# Patient Record
Sex: Female | Born: 1940 | Race: White | Hispanic: No | Marital: Married | Smoking: Never smoker
Health system: Southern US, Community
[De-identification: ages and names within clinical notes are randomized; demographics above are authoritative.]

## PROBLEM LIST (undated history)

## (undated) DIAGNOSIS — F0280 Dementia in other diseases classified elsewhere without behavioral disturbance: Secondary | ICD-10-CM

## (undated) DIAGNOSIS — I10 Essential (primary) hypertension: Secondary | ICD-10-CM

## (undated) DIAGNOSIS — N814 Uterovaginal prolapse, unspecified: Secondary | ICD-10-CM

## (undated) DIAGNOSIS — G309 Alzheimer's disease, unspecified: Secondary | ICD-10-CM

## (undated) HISTORY — DX: Alzheimer's disease, unspecified: G30.9

## (undated) HISTORY — PX: HEMORROIDECTOMY: SUR656

## (undated) HISTORY — PX: DIAPHRAGM SURGERY: SHX612

## (undated) HISTORY — DX: Essential (primary) hypertension: I10

## (undated) HISTORY — PX: BUNIONECTOMY: SHX129

## (undated) HISTORY — DX: Uterovaginal prolapse, unspecified: N81.4

## (undated) HISTORY — DX: Dementia in other diseases classified elsewhere, unspecified severity, without behavioral disturbance, psychotic disturbance, mood disturbance, and anxiety: F02.80

---

## 1974-09-05 HISTORY — PX: VAGINAL HYSTERECTOMY: SUR661

## 1998-09-02 ENCOUNTER — Other Ambulatory Visit: Admission: RE | Admit: 1998-09-02 | Discharge: 1998-09-02 | Payer: Self-pay | Admitting: Obstetrics and Gynecology

## 1999-09-13 ENCOUNTER — Other Ambulatory Visit: Admission: RE | Admit: 1999-09-13 | Discharge: 1999-09-13 | Payer: Self-pay | Admitting: Obstetrics and Gynecology

## 2000-02-17 ENCOUNTER — Encounter: Payer: Self-pay | Admitting: Gastroenterology

## 2000-02-17 ENCOUNTER — Encounter: Admission: RE | Admit: 2000-02-17 | Discharge: 2000-02-17 | Payer: Self-pay | Admitting: Gastroenterology

## 2000-03-23 ENCOUNTER — Encounter (INDEPENDENT_AMBULATORY_CARE_PROVIDER_SITE_OTHER): Payer: Self-pay | Admitting: Specialist

## 2000-03-23 ENCOUNTER — Ambulatory Visit (HOSPITAL_COMMUNITY): Admission: RE | Admit: 2000-03-23 | Discharge: 2000-03-23 | Payer: Self-pay | Admitting: Gastroenterology

## 2000-08-17 ENCOUNTER — Encounter: Payer: Self-pay | Admitting: Thoracic Surgery

## 2000-08-17 ENCOUNTER — Encounter: Admission: RE | Admit: 2000-08-17 | Discharge: 2000-08-17 | Payer: Self-pay | Admitting: Thoracic Surgery

## 2000-09-11 ENCOUNTER — Encounter: Payer: Self-pay | Admitting: Thoracic Surgery

## 2000-09-11 ENCOUNTER — Inpatient Hospital Stay (HOSPITAL_COMMUNITY): Admission: RE | Admit: 2000-09-11 | Discharge: 2000-09-17 | Payer: Self-pay | Admitting: Thoracic Surgery

## 2000-09-12 ENCOUNTER — Encounter: Payer: Self-pay | Admitting: Thoracic Surgery

## 2000-09-13 ENCOUNTER — Encounter: Payer: Self-pay | Admitting: Thoracic Surgery

## 2000-09-14 ENCOUNTER — Encounter: Payer: Self-pay | Admitting: Thoracic Surgery

## 2000-09-15 ENCOUNTER — Encounter: Payer: Self-pay | Admitting: Thoracic Surgery

## 2000-09-16 ENCOUNTER — Encounter: Payer: Self-pay | Admitting: Thoracic Surgery

## 2000-09-17 ENCOUNTER — Encounter: Payer: Self-pay | Admitting: Thoracic Surgery

## 2000-09-20 ENCOUNTER — Encounter: Payer: Self-pay | Admitting: Thoracic Surgery

## 2000-09-20 ENCOUNTER — Encounter: Admission: RE | Admit: 2000-09-20 | Discharge: 2000-09-20 | Payer: Self-pay | Admitting: Thoracic Surgery

## 2000-10-03 ENCOUNTER — Encounter: Payer: Self-pay | Admitting: Thoracic Surgery

## 2000-10-03 ENCOUNTER — Encounter: Admission: RE | Admit: 2000-10-03 | Discharge: 2000-10-03 | Payer: Self-pay | Admitting: Thoracic Surgery

## 2000-10-25 ENCOUNTER — Encounter: Payer: Self-pay | Admitting: Thoracic Surgery

## 2000-10-25 ENCOUNTER — Encounter: Admission: RE | Admit: 2000-10-25 | Discharge: 2000-10-25 | Payer: Self-pay | Admitting: Thoracic Surgery

## 2000-11-28 ENCOUNTER — Other Ambulatory Visit: Admission: RE | Admit: 2000-11-28 | Discharge: 2000-11-28 | Payer: Self-pay | Admitting: Obstetrics and Gynecology

## 2000-12-12 ENCOUNTER — Encounter: Admission: RE | Admit: 2000-12-12 | Discharge: 2000-12-12 | Payer: Self-pay | Admitting: Thoracic Surgery

## 2000-12-12 ENCOUNTER — Encounter: Payer: Self-pay | Admitting: Thoracic Surgery

## 2001-01-23 ENCOUNTER — Encounter: Payer: Self-pay | Admitting: Thoracic Surgery

## 2001-01-23 ENCOUNTER — Encounter: Admission: RE | Admit: 2001-01-23 | Discharge: 2001-01-23 | Payer: Self-pay | Admitting: Thoracic Surgery

## 2001-02-19 ENCOUNTER — Encounter: Admission: RE | Admit: 2001-02-19 | Discharge: 2001-02-19 | Payer: Self-pay | Admitting: Gastroenterology

## 2001-02-19 ENCOUNTER — Encounter: Payer: Self-pay | Admitting: Gastroenterology

## 2001-03-27 ENCOUNTER — Encounter: Payer: Self-pay | Admitting: Thoracic Surgery

## 2001-03-27 ENCOUNTER — Encounter: Admission: RE | Admit: 2001-03-27 | Discharge: 2001-03-27 | Payer: Self-pay | Admitting: Thoracic Surgery

## 2001-12-03 ENCOUNTER — Other Ambulatory Visit: Admission: RE | Admit: 2001-12-03 | Discharge: 2001-12-03 | Payer: Self-pay | Admitting: Obstetrics and Gynecology

## 2002-02-26 ENCOUNTER — Encounter: Payer: Self-pay | Admitting: Obstetrics and Gynecology

## 2002-02-26 ENCOUNTER — Encounter: Admission: RE | Admit: 2002-02-26 | Discharge: 2002-02-26 | Payer: Self-pay | Admitting: Obstetrics and Gynecology

## 2002-12-27 ENCOUNTER — Other Ambulatory Visit: Admission: RE | Admit: 2002-12-27 | Discharge: 2002-12-27 | Payer: Self-pay | Admitting: Obstetrics and Gynecology

## 2003-03-03 ENCOUNTER — Encounter: Payer: Self-pay | Admitting: Obstetrics and Gynecology

## 2003-03-03 ENCOUNTER — Ambulatory Visit (HOSPITAL_COMMUNITY): Admission: RE | Admit: 2003-03-03 | Discharge: 2003-03-03 | Payer: Self-pay | Admitting: Obstetrics and Gynecology

## 2003-03-24 ENCOUNTER — Encounter: Admission: RE | Admit: 2003-03-24 | Discharge: 2003-03-24 | Payer: Self-pay | Admitting: Obstetrics and Gynecology

## 2003-03-24 ENCOUNTER — Encounter: Payer: Self-pay | Admitting: Obstetrics and Gynecology

## 2003-12-29 ENCOUNTER — Other Ambulatory Visit: Admission: RE | Admit: 2003-12-29 | Discharge: 2003-12-29 | Payer: Self-pay | Admitting: Obstetrics and Gynecology

## 2004-03-03 ENCOUNTER — Ambulatory Visit (HOSPITAL_COMMUNITY): Admission: RE | Admit: 2004-03-03 | Discharge: 2004-03-03 | Payer: Self-pay | Admitting: Obstetrics and Gynecology

## 2005-05-30 ENCOUNTER — Other Ambulatory Visit: Admission: RE | Admit: 2005-05-30 | Discharge: 2005-05-30 | Payer: Self-pay | Admitting: Obstetrics and Gynecology

## 2005-05-30 ENCOUNTER — Ambulatory Visit (HOSPITAL_COMMUNITY): Admission: RE | Admit: 2005-05-30 | Discharge: 2005-05-30 | Payer: Self-pay | Admitting: Obstetrics and Gynecology

## 2006-06-14 ENCOUNTER — Ambulatory Visit (HOSPITAL_COMMUNITY): Admission: RE | Admit: 2006-06-14 | Discharge: 2006-06-14 | Payer: Self-pay | Admitting: Internal Medicine

## 2007-06-13 ENCOUNTER — Other Ambulatory Visit: Admission: RE | Admit: 2007-06-13 | Discharge: 2007-06-13 | Payer: Self-pay | Admitting: Obstetrics and Gynecology

## 2007-06-20 ENCOUNTER — Ambulatory Visit (HOSPITAL_COMMUNITY): Admission: RE | Admit: 2007-06-20 | Discharge: 2007-06-20 | Payer: Self-pay | Admitting: Internal Medicine

## 2007-06-21 ENCOUNTER — Ambulatory Visit (HOSPITAL_COMMUNITY): Admission: RE | Admit: 2007-06-21 | Discharge: 2007-06-21 | Payer: Self-pay | Admitting: Obstetrics and Gynecology

## 2008-06-18 ENCOUNTER — Ambulatory Visit: Payer: Self-pay | Admitting: Obstetrics and Gynecology

## 2008-06-25 ENCOUNTER — Ambulatory Visit (HOSPITAL_COMMUNITY): Admission: RE | Admit: 2008-06-25 | Discharge: 2008-06-25 | Payer: Self-pay | Admitting: Obstetrics and Gynecology

## 2009-06-24 ENCOUNTER — Ambulatory Visit: Payer: Self-pay | Admitting: Obstetrics and Gynecology

## 2009-06-24 ENCOUNTER — Other Ambulatory Visit: Admission: RE | Admit: 2009-06-24 | Discharge: 2009-06-24 | Payer: Self-pay | Admitting: Obstetrics and Gynecology

## 2009-06-24 ENCOUNTER — Encounter: Payer: Self-pay | Admitting: Obstetrics and Gynecology

## 2009-07-15 ENCOUNTER — Ambulatory Visit (HOSPITAL_COMMUNITY): Admission: RE | Admit: 2009-07-15 | Discharge: 2009-07-15 | Payer: Self-pay | Admitting: Obstetrics and Gynecology

## 2010-06-30 ENCOUNTER — Ambulatory Visit: Payer: Self-pay | Admitting: Obstetrics and Gynecology

## 2010-07-20 ENCOUNTER — Ambulatory Visit: Payer: Self-pay | Admitting: Obstetrics and Gynecology

## 2010-07-21 ENCOUNTER — Ambulatory Visit (HOSPITAL_COMMUNITY): Admission: RE | Admit: 2010-07-21 | Discharge: 2010-07-21 | Payer: Self-pay | Admitting: Obstetrics and Gynecology

## 2010-09-26 ENCOUNTER — Encounter: Payer: Self-pay | Admitting: Internal Medicine
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
## 2011-01-21 NOTE — Discharge Summary (Signed)
[place]. [facility]  Patient:    Jocelyn Wolfe, Jocelyn Wolfe                    MRN: [id number] Adm. Date:  [id number] Disc. Date: 09/16/00 Attending:  Cameron Proud Dictator:   Adair Patter, P.A. CC:         Durwin Reges., M.D.  Genene Churn. Sherin Quarry, M.D.   Discharge Summary  DATE OF BIRTH:  [birth year]  ADMITTING DIAGNOSIS:  Left diaphragmatic eventration.  SECONDARY DIAGNOSES: 1. Hypertension. 2. Hypercholesterolemia. 3. Gastroesophageal reflux disease. 4. Asthma. 5. Depression. 5. Ulcerative colitis.  DISCHARGE DIAGNOSES: Status post plication of left hemidiaphragm.  HOSPITAL PROCEDURES: 1. Left video-assisted thoracoscopy. 2. Left thoracotomy. 3. Plication of left hemidiaphragm.  HOSPITAL COURSE:  Jocelyn Wolfe was admitted to [facility] on September 11, 2000, secondary to left diaphragmatic eventration.  She underwent left video-assisted thoracoscopy, left thoracotomy and plication of left hemidiaphragm.  This procedure was performed by Dr. Edwyna Shell under general endotracheal anesthesia.  No complications were noted during the procedure. The patient had an uneventful hospital course.  She was discharged home in stable and satisfactory condition on September 16, 2000.  DISCHARGE MEDICATIONS:  1. Tylox one to two tablets every four to six hours as needed for pain.  2. Lipitor 10 mg 1/2 tablet daily.  3. Norvasc 10 mg one tablet daily.  4. Celexa 20 mg 1/2 tablet daily.  5. Glucosamine 1000 mg one tablet daily.  6. Aspirin 81 mg one tablet daily.  7. Azulfidine 500 mg one tablet q.d.  8. Guaifenesin 600 mg two tablets twice daily.  9. Tylenol as needed. 10. Enalapril 10 mg 1-1/2 tablets twice daily. 11. Folic acid 1 mg one tablet daily. 12. Vaseretic 10/25 mg 1/2 tablet daily. 13. Premarin 0.625 mg one tablet daily. 14. Albuterol inhaler two puffs every four hours as needed. 15. Advair 250/50 one tablet twice  daily.  DISCHARGE ACTIVITY:  The patient is told to avoid strenuous activity.  DISCHARGE DIET:  The patient is told to consume low fat, low salt, well balanced diet.  WOUND CARE:  The patient was told she could shower and clean wounds with mild soap and water.  DISPOSITION:  Home.  FOLLOW-UP:  The patient is to follow up with Dr. Edwyna Shell at his office on September 22, 2000, at 2:30 p.m.  She is also told to go to [facility] one hour before her appointment with Dr. Edwyna Shell to have a chest x-ray taken. DD:  09/15/00 TD:  09/16/00 Job: [id number]

## 2011-01-21 NOTE — Procedures (Signed)
[place]. [facility]  Patient:    Jocelyn Wolfe, Jocelyn Wolfe                    MRN: [id number] Proc. Date: 09/11/00 Adm. Date:  [id number] Attending:  Cameron Proud CC:         Genene Churn. Sherin Quarry, M.D.  Durwin Reges., M.D.   Procedure Report  PREOPERATIVE DIAGNOSIS:  Paralyzed left diaphragm or ____________ left diaphragm with marked dyspnea.  POSTOPERATIVE DIAGNOSIS:  Paralyzed left diaphragm or ____________ left diaphragm with marked dyspnea.  OPERATION PERFORMED:  Plication of left diaphragm, ____________  SURGEON:  D. Karle Plumber, M.D.  ASSISTANT:  Mr. Rolan Bucco  ANESTHESIA:  General.  DESCRIPTION OF PROCEDURE:  After percutaneous insertion of all monitoring lines, the patient underwent general anesthesia.  He was prepped and draped in the usual sterile manner.  ____________ general left lateral thoracotomy position this patient had a progressive left diaphragm elevation that was causing marked dyspnea ____________ start about 18 months.  It looked like it was probably paralyzed although eventration could not be ruled out.  CT scan confirmed and it was decided to do a plication in order to improve the left lower lobe atelectasis and ____________.  To trocar sites were made in the anterior and posterior axillary lines at the fifth intercostal space and the 0 degree scope was inserted. The diaphragm could be seen markedly elevated.  It looked like the muscle was intact peripherally with stretching out of the central tendon.  It looked more like eventration than a paralyzed diaphragm. A 7 cm incision incision was made over the 9th intercostal space at the midaxillary line and a small Tuffier was inserted and then starting laterally, an interrupted suture was placed in the diaphragm.  The midportion of the diaphragm was pushed down with a Kaiser ring forceps and the diaphragm was plicated with an over and over stitch going from  lateral to medial and then from medial back to lateral with #1 Prolene.  After this had been done, another suture was run from laterally to medially and back over this further imbricating the diaphragm to bring the diaphragm back to its normal position. Several interrupted #1 Prolenes were placed to further reinforce the suture line.  After this had been accomplished, the chest tube was placed at the anterior trocar site and then through the posterior trocar site.  The posterior trocar site was closed with 3-0 Vicryl.  Another chest tube was placed posteriorly.  The lung was re-expanded.  The chest was closed with #2 chromic and #1 Vicryl in the muscle layer and 3-0 Vicryl as a subcuticular stitch.  The patient returned to the recovery room in stable condition. DD:  09/11/00 TD:  09/11/00 Job: [id number]

## 2011-01-21 NOTE — Discharge Summary (Signed)
[place]. [facility]  Patient:    Jocelyn Wolfe, Jocelyn Wolfe                    MRN: [id number] Adm. Date:  [id number] Disc. Date: [id number] Attending:  Cameron Proud Dictator:   Lissa Merlin, P.A.                           Discharge Summary  ADDENDUM:  Jocelyn Wolfe was originally anticipated to be discharged on September 16, 2000.  However, she complained of nausea and constipation.  Her discharge was held.  Abdominal x-ray was taken which showed no bowel obstruction wtih normal stool pattern.  She was given Phenergan for nausea and Dulcolax, and mag citrate and soapsuds enema.  This produced bowel movement.  By the next day, Jocelyn Wolfe felt better with no more nausea.  She was deemed stable and suitable for discharge home and was subsequently discharged.  There were no changes with her previous discharge instructions or medications. DD:  09/25/00 TD:  09/26/00 Job: [id number]

## 2011-06-13 ENCOUNTER — Other Ambulatory Visit: Payer: Self-pay | Admitting: Obstetrics and Gynecology

## 2011-06-13 DIAGNOSIS — Z1231 Encounter for screening mammogram for malignant neoplasm of breast: Secondary | ICD-10-CM

## 2011-06-28 DIAGNOSIS — G309 Alzheimer's disease, unspecified: Secondary | ICD-10-CM | POA: Insufficient documentation

## 2011-06-28 DIAGNOSIS — N814 Uterovaginal prolapse, unspecified: Secondary | ICD-10-CM | POA: Insufficient documentation

## 2011-07-06 ENCOUNTER — Ambulatory Visit (INDEPENDENT_AMBULATORY_CARE_PROVIDER_SITE_OTHER): Payer: Medicare Other | Admitting: Obstetrics and Gynecology

## 2011-07-06 ENCOUNTER — Encounter: Payer: Self-pay | Admitting: Obstetrics and Gynecology

## 2011-07-06 VITALS — BP 124/80 | Ht 61.0 in | Wt 123.0 lb

## 2011-07-06 DIAGNOSIS — R351 Nocturia: Secondary | ICD-10-CM

## 2011-07-06 DIAGNOSIS — I10 Essential (primary) hypertension: Secondary | ICD-10-CM | POA: Insufficient documentation

## 2011-07-06 DIAGNOSIS — N816 Rectocele: Secondary | ICD-10-CM

## 2011-07-06 DIAGNOSIS — N952 Postmenopausal atrophic vaginitis: Secondary | ICD-10-CM

## 2011-07-06 DIAGNOSIS — R634 Abnormal weight loss: Secondary | ICD-10-CM

## 2011-07-06 NOTE — Progress Notes (Signed)
Subjective:     Patient ID: Jocelyn Wolfe, female   DOB: June 16, 1941, 70 y.o.   MRN: [id number]  HPIpatient came back to see me today for further followup. She has severe Alzheimer's and cannot give [place] a history. She came with her husband who is a very good historian. He thinks her biggest issue is weight loss. In reviewing her records she has lost 14 pounds from her last visit. The husband thing she's really eating a she used to. They have discussed this with her PCP who is just instructed to husband to feed her whatever she will eat. She is having no pelvic pain. She is having no vaginal bleeding. 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 She is urinary frequency and nocturia without urgency or incontinence. She has a rectocele but does not have trouble the main stool. We have scheduled her mammogram. She has bone densities at her PCP and they are normal.   Review of Systems  Constitutional: Positive for unexpected weight change.  HENT: Negative.   Eyes: Negative.   Respiratory: Negative.   Cardiovascular:       Hypertension  Gastrointestinal:       Colitis on medication  Genitourinary: Positive for frequency.  Musculoskeletal: Negative.   Neurological:       Alzheimer's under treatment   Hematological: Negative.   Psychiatric/Behavioral: Negative.        Objective:   Physical ExamHEENT: Within normal limits. Kennon Portela present Neck: No masses. Supraclavicular lymph nodes: Not enlarged. Breasts: Examined in both sitting and lying position. Symmetrical without skin changes or masses. Abdomen: Soft no masses guarding or rebound. No hernias. Pelvic: External within normal limits. BUS within normal limits. Vaginal examination shows poor estrogen effect, no cystocele enterocele. 1.5 degree rectocele Cervix and uterus absent. Adnexa within normal limits. Rectovaginal confirmatory. Extremities within normal  limits.      Assessment:     #1.weight loss #2. Rectocele #3. Atrophic vaginitis #4. Nocturia    Plan:     continued observation of the above. Weight loss to be managed through PCP.

## 2011-07-27 ENCOUNTER — Ambulatory Visit (HOSPITAL_COMMUNITY)
Admission: RE | Admit: 2011-07-27 | Discharge: 2011-07-27 | Disposition: A | Discharge status: Home / Self Care | Payer: Medicare Other | Source: Ambulatory Visit | Attending: Obstetrics and Gynecology | Admitting: Obstetrics and Gynecology

## 2011-07-27 DIAGNOSIS — Z1231 Encounter for screening mammogram for malignant neoplasm of breast: Secondary | ICD-10-CM

## 2011-12-06 ENCOUNTER — Emergency Department (HOSPITAL_COMMUNITY): Payer: Medicare Other

## 2011-12-06 ENCOUNTER — Other Ambulatory Visit: Payer: Self-pay

## 2011-12-06 ENCOUNTER — Emergency Department (HOSPITAL_COMMUNITY)
Admission: EM | Admit: 2011-12-06 | Discharge: 2011-12-06 | Disposition: A | Discharge status: Home / Self Care | Payer: Medicare Other | Attending: Emergency Medicine | Admitting: Emergency Medicine

## 2011-12-06 DIAGNOSIS — R569 Unspecified convulsions: Secondary | ICD-10-CM | POA: Insufficient documentation

## 2011-12-06 DIAGNOSIS — Z7982 Long term (current) use of aspirin: Secondary | ICD-10-CM | POA: Insufficient documentation

## 2011-12-06 DIAGNOSIS — I10 Essential (primary) hypertension: Secondary | ICD-10-CM | POA: Insufficient documentation

## 2011-12-06 DIAGNOSIS — G309 Alzheimer's disease, unspecified: Secondary | ICD-10-CM | POA: Insufficient documentation

## 2011-12-06 DIAGNOSIS — F29 Unspecified psychosis not due to a substance or known physiological condition: Secondary | ICD-10-CM | POA: Insufficient documentation

## 2011-12-06 DIAGNOSIS — Z79899 Other long term (current) drug therapy: Secondary | ICD-10-CM | POA: Insufficient documentation

## 2011-12-06 DIAGNOSIS — F0280 Dementia in other diseases classified elsewhere without behavioral disturbance: Secondary | ICD-10-CM | POA: Insufficient documentation

## 2011-12-06 LAB — DIFFERENTIAL
Basophils Absolute: 0 10*3/uL (ref 0.0–0.1)
Basophils Relative: 0 % (ref 0–1)
Eosinophils Absolute: 0.3 10*3/uL (ref 0.0–0.7)
Eosinophils Relative: 4 % (ref 0–5)
Lymphocytes Relative: 15 % (ref 12–46)
Lymphs Abs: 1 10*3/uL (ref 0.7–4.0)
Neutro Abs: 4.8 10*3/uL (ref 1.7–7.7)

## 2011-12-06 LAB — COMPREHENSIVE METABOLIC PANEL
ALT: 29 U/L (ref 0–35)
Calcium: 9.6 mg/dL (ref 8.4–10.5)
Creatinine, Ser: 0.91 mg/dL (ref 0.50–1.10)
GFR calc Af Amer: 72 mL/min — ABNORMAL LOW (ref >90)
Sodium: 139 mEq/L (ref 135–145)
Total Bilirubin: 0.3 mg/dL (ref 0.3–1.2)

## 2011-12-06 LAB — URINALYSIS, ROUTINE W REFLEX MICROSCOPIC
Bilirubin Urine: NEGATIVE
Leukocytes, UA: NEGATIVE
Nitrite: NEGATIVE
Protein, ur: NEGATIVE mg/dL

## 2011-12-06 LAB — CBC
MCHC: 33.5 g/dL (ref 30.0–36.0)
MCV: 85.4 fL (ref 78.0–100.0)
RBC: 4.51 MIL/uL (ref 3.87–5.11)
WBC: 6.4 10*3/uL (ref 4.0–10.5)

## 2011-12-06 LAB — POCT I-STAT TROPONIN I

## 2011-12-06 NOTE — ED Notes (Signed)
Per ems-  Pt coming from home where pt was found laying in bed twitching left arm and leg and was unresponsive for "a couple minutes."  This was witnessed by husband. Pt does not have history of seizures.  Pt is at baseline neuro at this time, alert but disoriented to time, place and person.  Pt with history of alzheimer's dementia

## 2011-12-06 NOTE — Discharge Instructions (Signed)
Ms. Cotta bloodwork, urine, CT and xrays were all normal and did not show signs of a problem that may have caused her to have seizure like activity. It is important that she follow up with her neurologist as soon as possible for a recheck. Please plan to call their office in the morning to make an appointment. If she has recurrent symptoms, is acting differently than normal or with otherwise worsening condition, return to the ER.  Seizure, Adult A seizure is when the body shakes uncontrollably (convulsion). It can be a scary experience. A seizure is not a diagnosis. It is a sign that something else may be wrong with brain and/or spinal cord (central nervous system). In the Emergency Department, your condition is evaluated. The seizure is then treated. You will likely need follow-up with your caregiver. You will possibly need further testing and evaluation. Your caregiver or the specialist to whom you are referred will determine if further treatment is needed. After a seizure, you may be confused, dazed and drowsy. These problems (symptoms) often follow a seizure. Medication given to treat the seizure may also cause some of these changes. The time following a seizure is known as a refractory period. Hospital admission is seldom required unless there are other conditions present such as trauma or metabolic problems. Sometimes the seizure activity follows a fainting episode. This may have been caused by a brief drop in blood pressure. These fainting (syncopal) seizures are generally not a cause for concern.  HOME CARE INSTRUCTIONS   Follow up with your caregiver as suggested.   If any problems happen, get help right away.   Do not swim or drive until your caregiver says it is okay.  Document Released: 08/19/2000 Document Revised: 08/11/2011 Document Reviewed: 08/10/2011 [facility] Patient Information 2012 [place], [place].

## 2011-12-06 NOTE — ED Notes (Signed)
ems states negative stroke scale as pt was unable to follow commands but was moving all extremities and had good strength as she was pushing away with iv insertion.

## 2011-12-06 NOTE — ED Provider Notes (Signed)
History     CSN: [id number]  Arrival date & time 12/06/11  1252   First MD Initiated Contact with Patient 12/06/11 1256      Chief Complaint  Patient presents with  . Loss of Consciousness    (Consider location/radiation/quality/duration/timing/severity/associated sxs/prior treatment) HPI History from EMS. Patient unable to provide history as she has Alzheimer's and is nonverbal. Patient arrives from home where she was found by her husband apparently lying in bed and was unresponsive for several minutes. She was noted to be "twitching" her left arm and leg at that time. Was noted to be somewhat confused immediately afterwards but returned to her baseline. No hx of seizures. No bowel/bladder incontinence, generalized convulsions, tongue biting.   Per EMS, pt unable to follow commands to perform stroke scale en route but was noted to move all extremities and had good strength with pushing away when IV inserted.  Pt is followed by Dr. Anne Hahn with neurology.  Past Medical History  Diagnosis Date  . Alzheimer disease   . Uterine prolapse   . Hypertension     Past Surgical History  Procedure Date  . Bunionectomy   . Hemorroidectomy   . Diaphragm surgery     PARALYZED DIAPHRAGM  . Vaginal hysterectomy 1976    VAG HYST., ANTERIOR REPAIR    Family History  Problem Relation Age of Onset  . Cancer Mother     UTERINE  . Hypertension Father   . Hypertension Sister   . Heart disease Sister   . Diabetes Sister   . Hypertension Sister   . Heart disease Sister     History  Substance Use Topics  . Smoking status: Never Smoker   . Smokeless tobacco: Not on file  . Alcohol Use: No    OB History    Grav Para Term Preterm Abortions TAB SAB Ect Mult Living   2 2        2       Review of Systems  Unable to perform ROS: Dementia    Allergies  Phenergan  Home Medications   Current Outpatient Rx  Name Route Sig Dispense Refill  . AMLODIPINE BESYLATE 10 MG PO TABS Oral  Take 10 mg by mouth daily.     . ASPIRIN 81 MG PO TABS Oral Take 81 mg by mouth daily.      . ATORVASTATIN CALCIUM 40 MG PO TABS Oral Take 40 mg by mouth daily.      Marland Kitchen BENAZEPRIL-HYDROCHLOROTHIAZIDE 20-25 MG PO TABS Oral Take 1 tablet by mouth daily.      Marland Kitchen CALCIUM + D PO Oral Take 1 tablet by mouth 2 (two) times daily. 600 mg of calcium.    Marland Kitchen VITAMIN B12 PO Oral Take 1,000 mcg by mouth daily. Takes on tues and thurs only    . FLUTICASONE-SALMETEROL 250-50 MCG/DOSE IN AEPB Inhalation Inhale 1 puff into the lungs every 12 (twelve) hours.      Marland Kitchen FOLIC ACID 1 MG PO TABS Oral Take 1 mg by mouth daily.      Marland Kitchen GLUCOSAMINE HCL 1000 MG PO TABS Oral Take by mouth.      Marland Kitchen LORATADINE 10 MG PO TABS Oral Take 10 mg by mouth daily.      Marland Kitchen NAPROXEN SODIUM 220 MG PO CAPS Oral Take 220 mg by mouth daily.     Marland Kitchen RIVASTIGMINE 9.5 MG/24HR TD PT24 Transdermal Place 1 patch onto the skin daily.      . SULFASALAZINE 500 MG  PO TBEC Oral Take 500 mg by mouth 4 (four) times daily.        BP 143/73  Pulse 86  Temp(Src) 97 F (36.1 C) (Axillary)  Resp 16  SpO2 99% 98.2 rectal temp  Physical Exam  Nursing note and vitals reviewed. Constitutional: She appears well-developed and well-nourished. No distress.  HENT:  Head: Normocephalic and atraumatic.  Eyes: EOM are normal. Pupils are equal, round, and reactive to light.  Neck: Normal range of motion.  Cardiovascular: Normal rate and regular rhythm.   Pulmonary/Chest: Effort normal and breath sounds normal. No respiratory distress. She exhibits no tenderness.  Abdominal: Soft. Bowel sounds are normal. There is no tenderness. There is no rebound and no guarding.  Musculoskeletal: Normal range of motion.  Neurological: She is alert. No cranial nerve deficit.       Not cooperative with neuro exam. No gross deficits seen. No gross facial droop. Moving all 4 ext.   Skin: Skin is warm and dry. No rash noted. She is not diaphoretic.  Psychiatric: She has a normal mood  and affect.    ED Course  Procedures (including critical care time)   Date: 12/06/2011  Rate: 89  Rhythm: normal sinus rhythm  QRS Axis: normal  Intervals: normal  ST/T Wave abnormalities: normal  Conduction Disutrbances:none  Narrative Interpretation: EMS 12 lead from today reviewed, no significant changes to 12 lead performed here  Old EKG Reviewed: compared with Jan 2002, no significant changes    Labs Reviewed  COMPREHENSIVE METABOLIC PANEL - Abnormal; Notable for the following:    Glucose, Bld 121 (*)    AST 38 (*)    GFR calc non Af Amer 62 (*)    GFR calc Af Amer 72 (*)    All other components within normal limits  CBC  DIFFERENTIAL  POCT I-STAT TROPONIN I  URINALYSIS, ROUTINE W REFLEX MICROSCOPIC   Dg Chest 2 View  12/06/2011  *RADIOLOGY REPORT*  Clinical Data: Altered mental status.  CHEST - 2 VIEW  Comparison: None.  Findings: Lung volumes are relatively low.  No evidence of edema, infiltrate, mass or pleural fluid.  Heart size and mediastinal contours are within normal limits.  Bony thorax shows osteopenia and mild degenerative changes of the spine.  IMPRESSION: No active disease.  Original Report Authenticated By: Reola Calkins, M.D.   Ct Head Wo Contrast  12/06/2011  *RADIOLOGY REPORT*  Clinical Data: Altered mental status.  Possible loss of consciousness.  Seizure-like activity.  History of Alzheimer's dementia.  CT HEAD WITHOUT CONTRAST  Technique:  Contiguous axial images were obtained from the base of the skull through the vertex without contrast.  Comparison: None.  Findings: Moderate generalized atrophy is present.  Mild periventricular white matter hypoattenuation is present.  No acute cortical infarct, hemorrhage, or mass lesion is present.  A focal calcification along the right transverse sinus likely represents an arachnoid granulation.  The paranasal sinuses and mastoid air cells are clear.  The osseous skull is intact.  IMPRESSION:  1.  Moderate  generalized atrophy. 2.  No acute intracranial abnormality.  Original Report Authenticated By: Jamesetta Orleans. MATTERN, M.D.     1. Observed seizure-like activity       MDM  Case d/w Dr. Freida Busman, who saw pt with me. Feel that it is possible that this represented a seizure although she has no hx of this. Labs thus far generally unremarkable. No acute findings on head CT or CXR; lytes nl; H/H nl. UA also  unremarkable for infx.  Pt is followed by a neurologist, Dr. Anne Hahn, and family states they are able to make an appt for f/u with him. Findings d/w family; feel that she is stable to be discharged home with close outpatient follow up. Family given instructions to return should they witness same sx again. They verbalized understanding and were agreeable with this plan.         Grant Fontana, [place] 12/06/11 [phone]

## 2011-12-06 NOTE — ED Provider Notes (Signed)
Medical screening examination/treatment/procedure(s) were conducted as a shared visit with non-physician practitioner(s) and myself.  I personally evaluated the patient during the encounter  Spoke with family and they described a seizure-like episode. She is now back to her baseline she was postictal earlier. Patient has a neurologist Dr. Anne Hahn and will followup with him and they were instructed to bring the patient back should she have another seizure  Toy Baker, MD 12/06/11 1512

## 2011-12-07 NOTE — ED Provider Notes (Signed)
Medical screening examination/treatment/procedure(s) were conducted as a shared visit with non-physician practitioner(s) and myself.  I personally evaluated the patient during the encounter  Toy Baker, MD 12/07/11 [phone]

## 2012-06-26 ENCOUNTER — Other Ambulatory Visit: Payer: Self-pay | Admitting: Obstetrics and Gynecology

## 2012-06-26 DIAGNOSIS — Z1231 Encounter for screening mammogram for malignant neoplasm of breast: Secondary | ICD-10-CM

## 2012-08-01 ENCOUNTER — Ambulatory Visit (HOSPITAL_COMMUNITY)
Admission: RE | Admit: 2012-08-01 | Discharge: 2012-08-01 | Disposition: A | Discharge status: Home / Self Care | Payer: Medicare Other | Source: Ambulatory Visit | Attending: Obstetrics and Gynecology | Admitting: Obstetrics and Gynecology

## 2012-08-01 DIAGNOSIS — Z1231 Encounter for screening mammogram for malignant neoplasm of breast: Secondary | ICD-10-CM | POA: Insufficient documentation

## 2012-08-08 ENCOUNTER — Ambulatory Visit (INDEPENDENT_AMBULATORY_CARE_PROVIDER_SITE_OTHER): Payer: Medicare Other | Admitting: Obstetrics and Gynecology

## 2012-08-08 ENCOUNTER — Encounter: Payer: Self-pay | Admitting: Obstetrics and Gynecology

## 2012-08-08 VITALS — BP 120/70 | Ht 60.0 in | Wt 116.0 lb

## 2012-08-08 DIAGNOSIS — R350 Frequency of micturition: Secondary | ICD-10-CM

## 2012-08-08 DIAGNOSIS — F0390 Unspecified dementia without behavioral disturbance: Secondary | ICD-10-CM

## 2012-08-08 DIAGNOSIS — N816 Rectocele: Secondary | ICD-10-CM

## 2012-08-08 DIAGNOSIS — N952 Postmenopausal atrophic vaginitis: Secondary | ICD-10-CM

## 2012-08-08 NOTE — Progress Notes (Signed)
Patient came to see me today for further followup. She comes with her husband because she can give [place] no history with her Alzheimer's disease. She is clearly much worse this year and is not really speaking meaningfully. She had a vaginal hysterectomy and anterior repair for uterine prolapse and cystocele in 1976. She was done well since then without recurrence of any vaginal descensus. She is having no vaginal bleeding. She is having no pelvic pain. She does have a rectocele but her husband says she is unaware of it. She does have both urinary frequency and nocturia but he feels it's not a problem. She is now having dysuria or incontinence. She has always had normal Pap smears.Her last Pap was 2010.She has always had normal bone densities. Her last bone density was 2008. She has atrophic vaginitis and her husband says she has not bothered with it. They are  not sexually active.  ROS: 12 system review done. Pertinent positives above. Other positive is  Hypertension.  HEENT: Within normal limits.Kennon Portela present. Neck: No masses. Supraclavicular lymph nodes: Not enlarged. Breasts: Examined in both sitting and lying position. Symmetrical without skin changes or masses. Abdomen: Soft no masses guarding or rebound. No hernias. Pelvic: External within normal limits. BUS within normal limits. Vaginal examination shows Poor estrogen effect, no cystocele or  Enterocele. Second degree  rectocele. Cervix and uterus absent. Adnexa within normal limits. Rectovaginal confirmatory. Extremities within normal limits.  Assessment: #1. Alzheimer's disease #2. Urinary frequency and nocturia #3. Atrophic vaginitis #4. Rectocele.  Plan: Pap not done.The new Pap smear guidelines were discussed with the patient. Continue yearly mammograms.

## 2012-08-08 NOTE — Patient Instructions (Signed)
Continue yearly mammograms 

## 2012-08-17 ENCOUNTER — Encounter: Payer: Self-pay | Admitting: Obstetrics and Gynecology

## 2012-12-30 IMAGING — CR DG CHEST 2V
3 series · 3 of 3 positions shown · non-contrast
Comparison: None.

CLINICAL DATA: Altered mental status.

CHEST - 2 VIEW

[w chest lat]
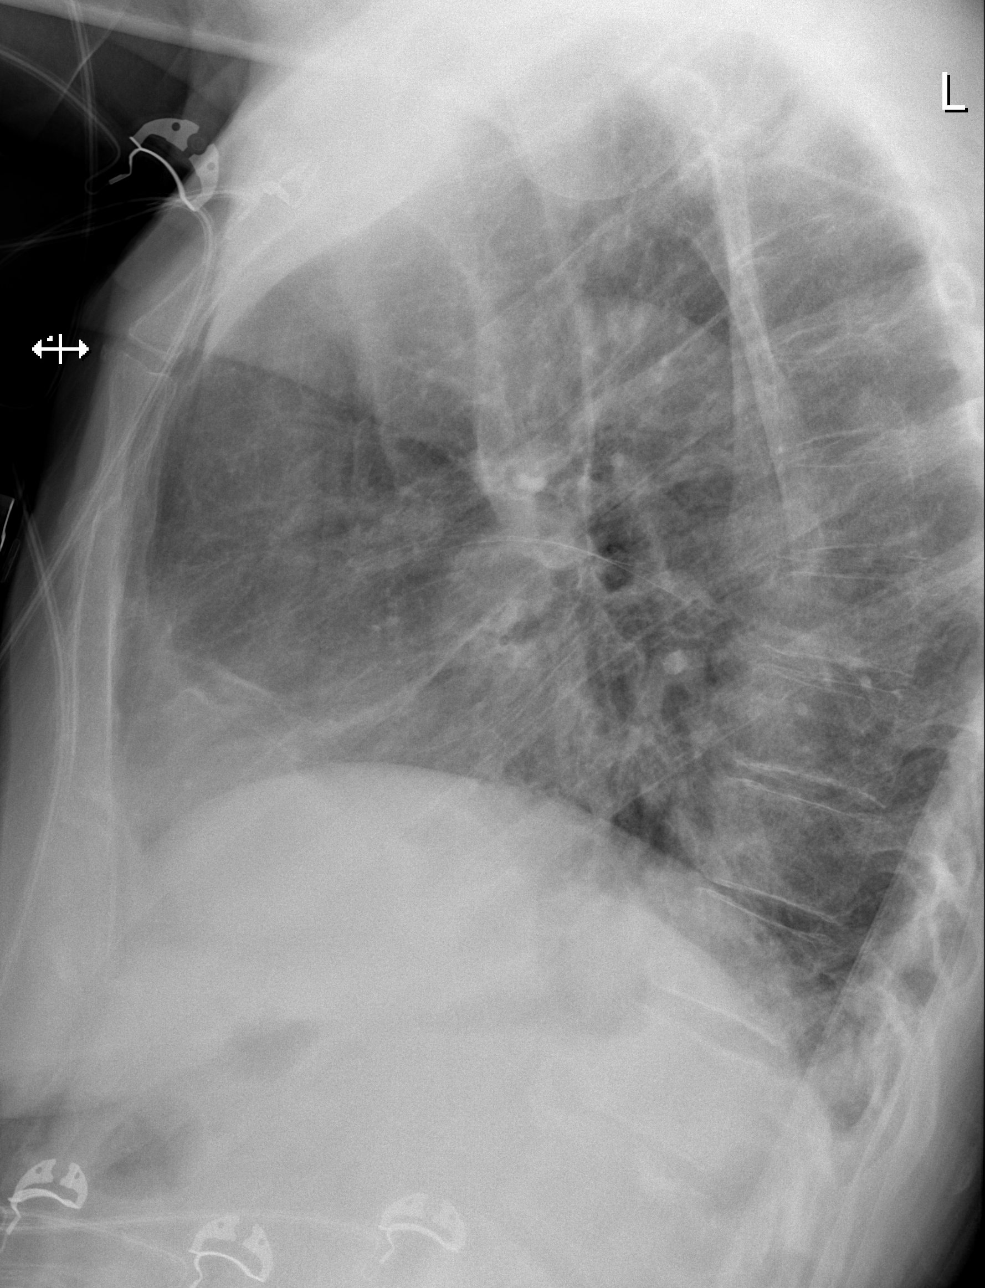

[x chest ap (1 of 2)]
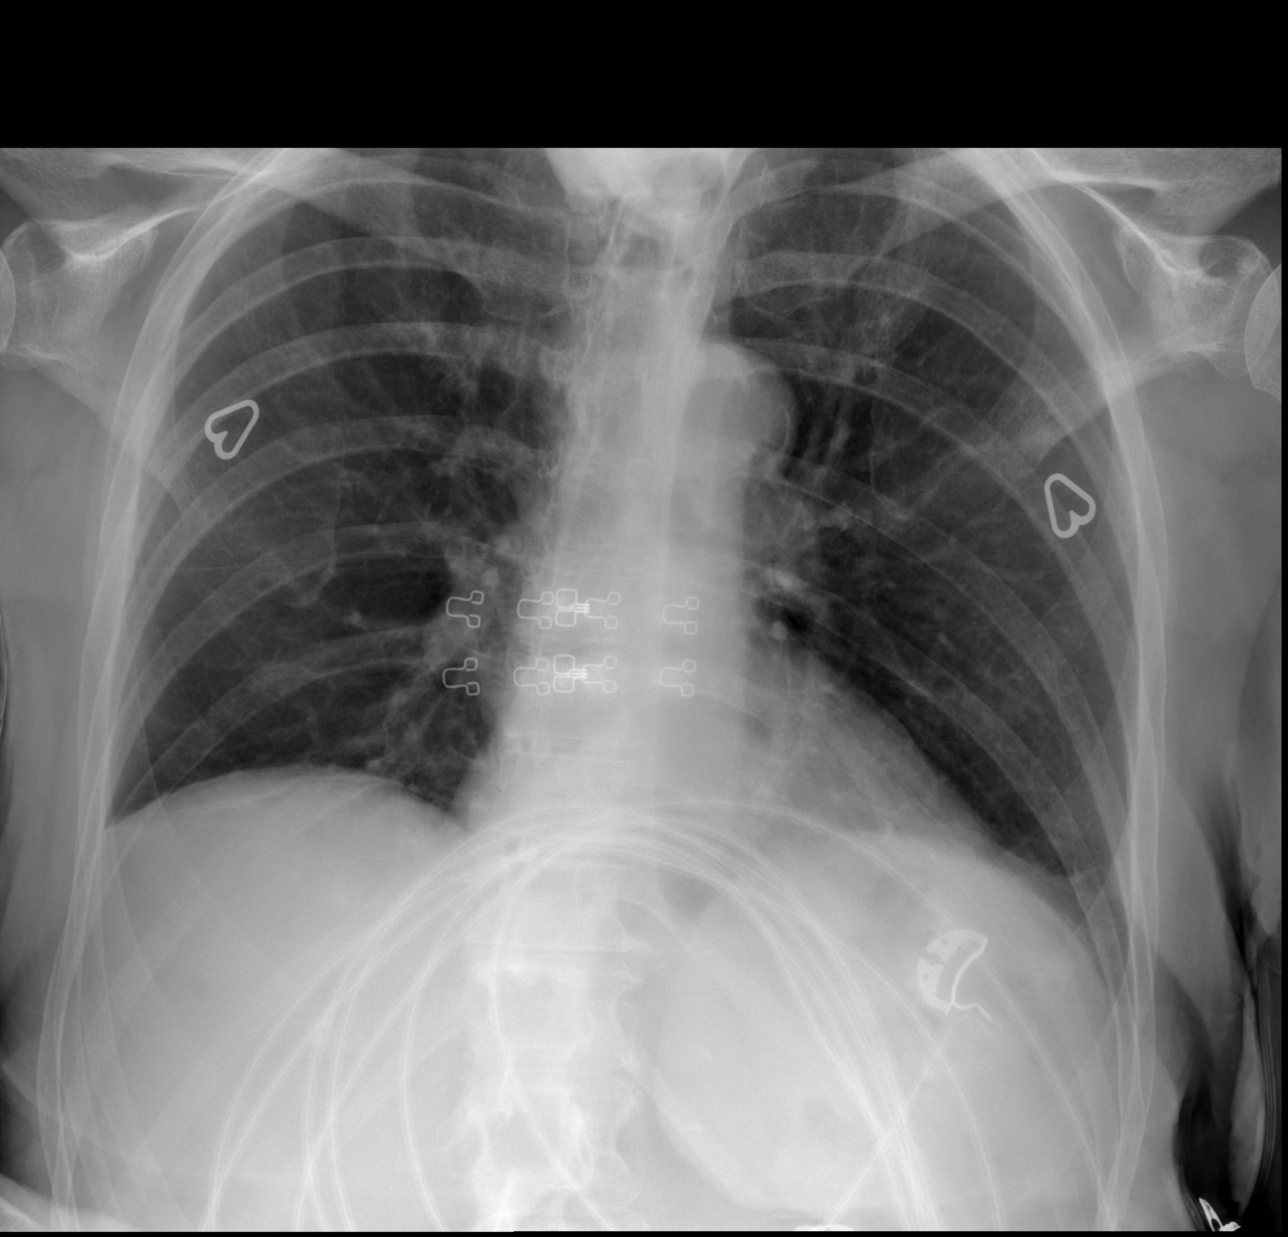

[x chest ap (2 of 2)]
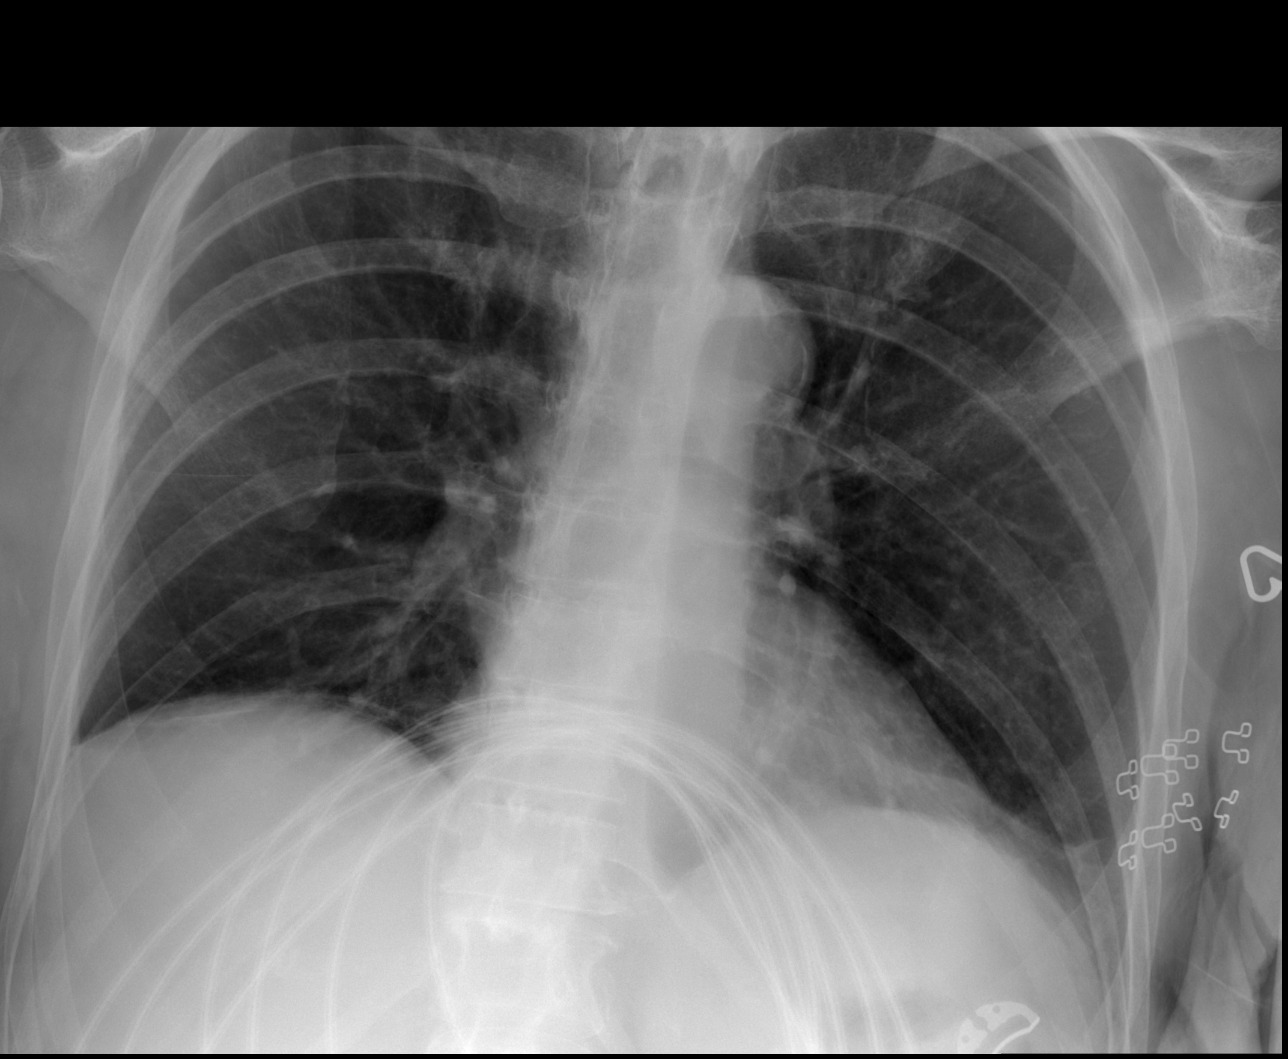

[3 of 3 positions shown; findings below may reference images not displayed]

FINDINGS: Lung volumes are relatively low.  No evidence of edema,
infiltrate, mass or pleural fluid.  Heart size and mediastinal
contours are within normal limits.  Bony thorax shows osteopenia
and mild degenerative changes of the spine.
IMPRESSION: No active disease.

## 2012-12-30 IMAGING — CT CT HEAD W/O CM
1 series · 16 of 30 positions shown, 20 images · non-contrast
Comparison: None.

CLINICAL DATA: Altered mental status.  Possible loss of
consciousness.  Seizure-like activity.  History of Alzheimer's
dementia.

CT HEAD WITHOUT CONTRAST
TECHNIQUE: Contiguous axial images were obtained from the base of
the skull through the vertex without contrast.

[Series 2: head routine 4.8 h37s · axial · 0.43mm/px · z∈[-129,+1]mm · 16 of 30 slices shown, 20 images]
[im 2/30  brain]
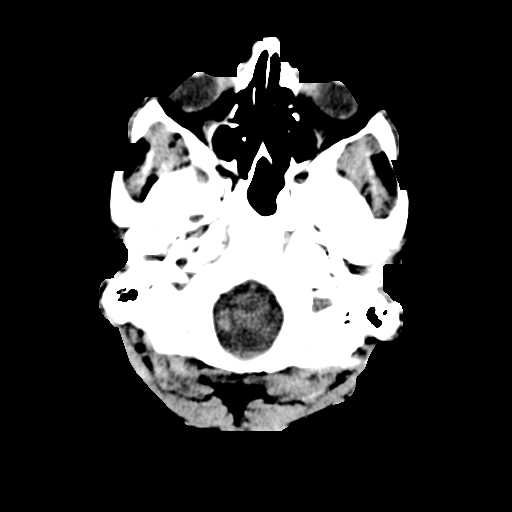
[im 2/30  bone]
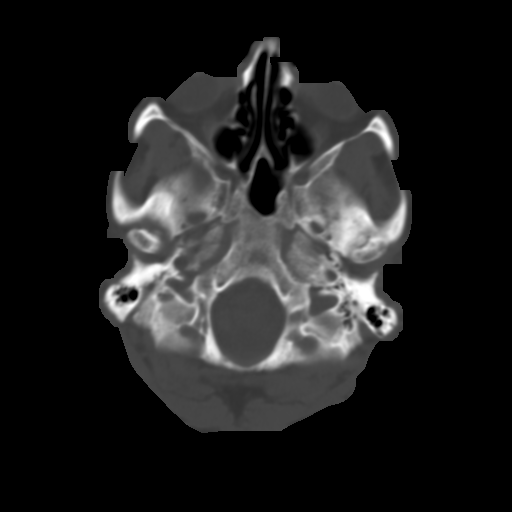
[im 4/30  brain]
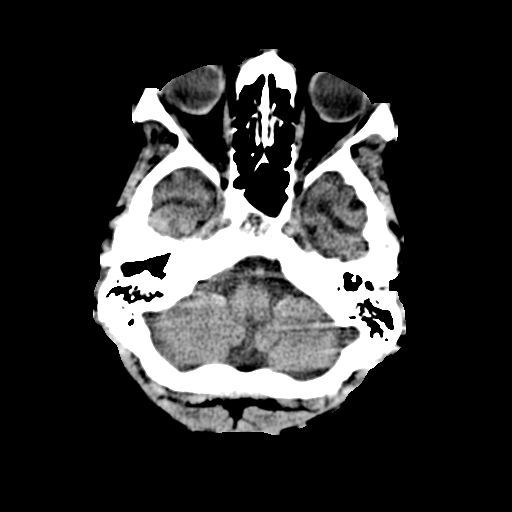
[im 6/30  brain]
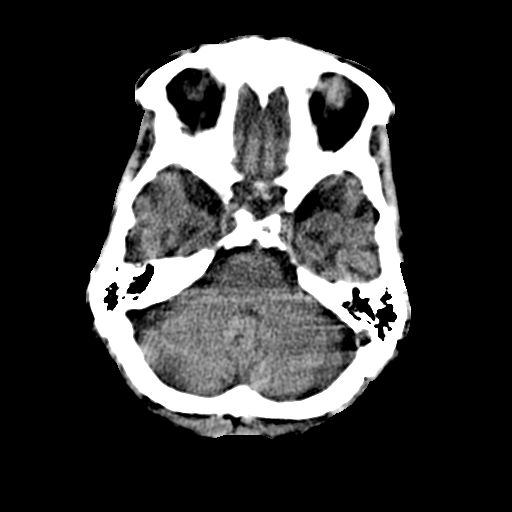
[im 8/30  brain]
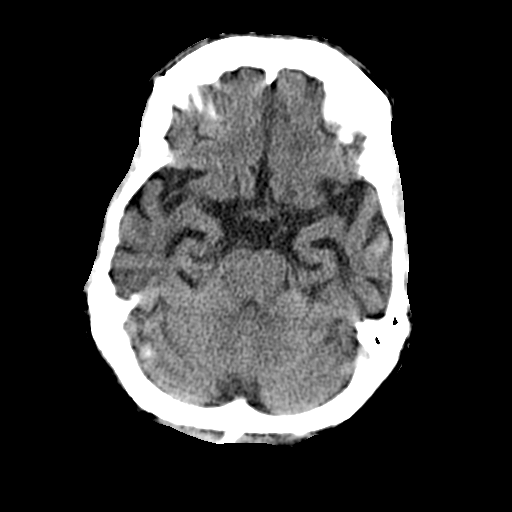
[im 9/30  brain]
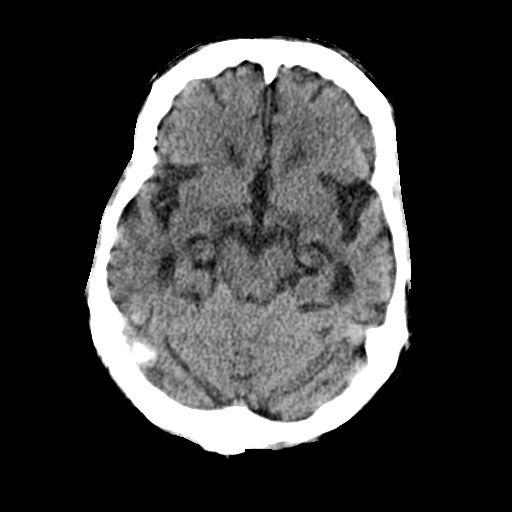
[im 9/30  bone]
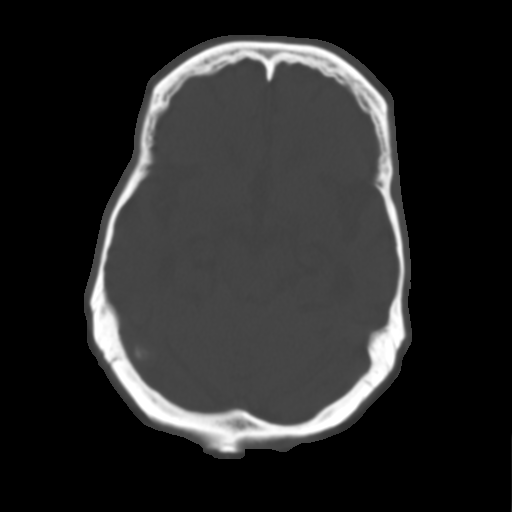
[im 11/30  brain]
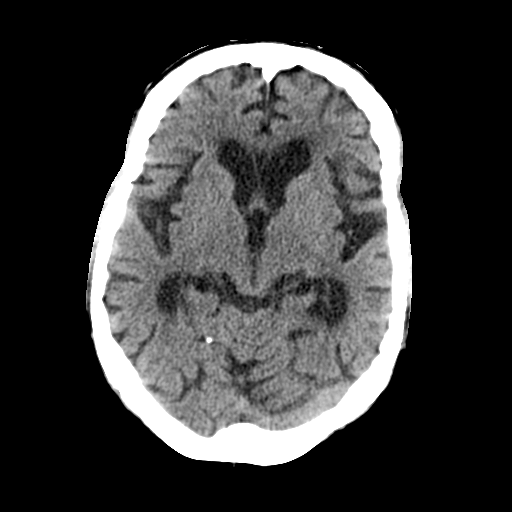
[im 13/30  brain]
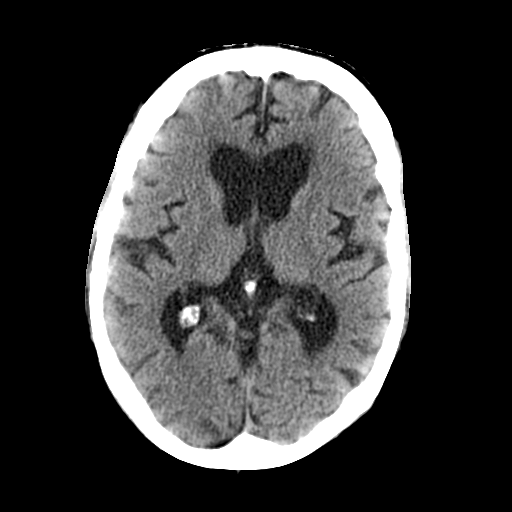
[im 15/30  brain]
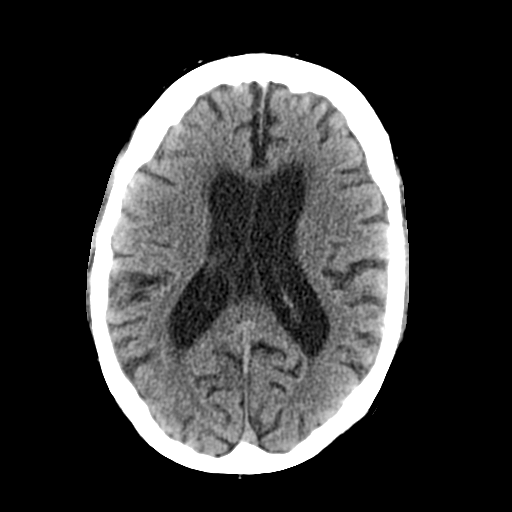
[im 16/30  brain]
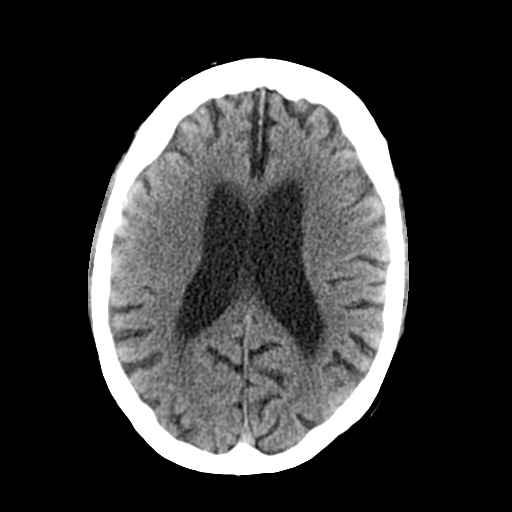
[im 16/30  bone]
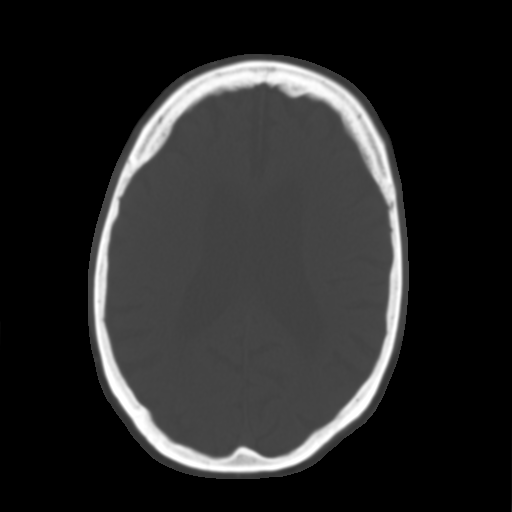
[im 18/30  brain]
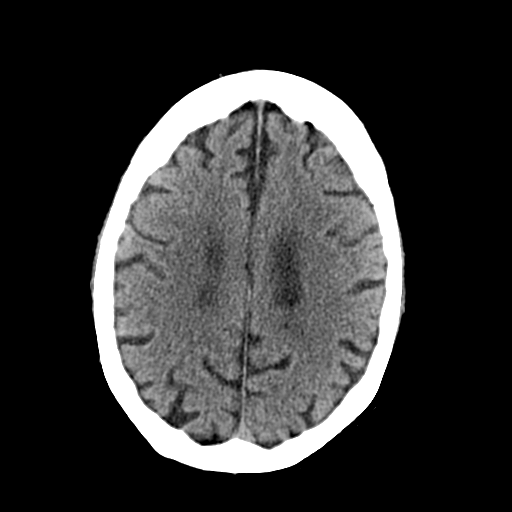
[im 20/30  brain]
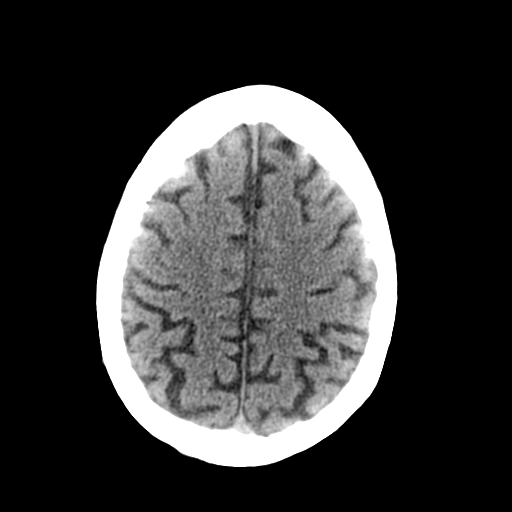
[im 22/30  brain]
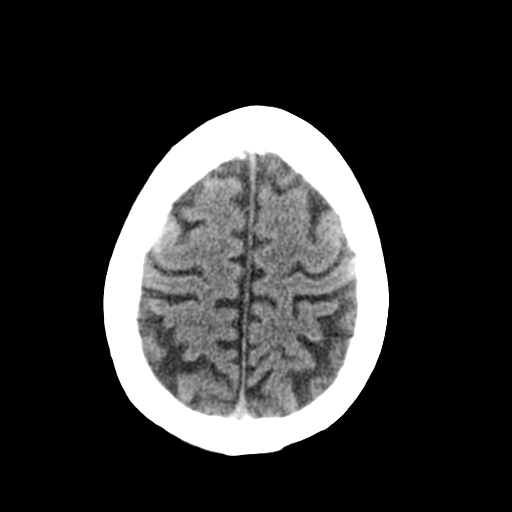
[im 23/30  brain]
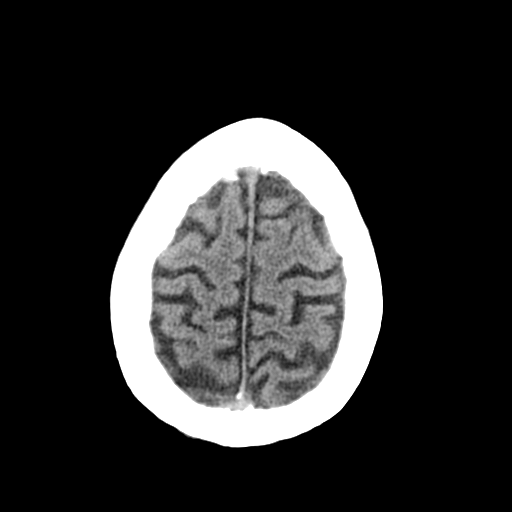
[im 23/30  bone]
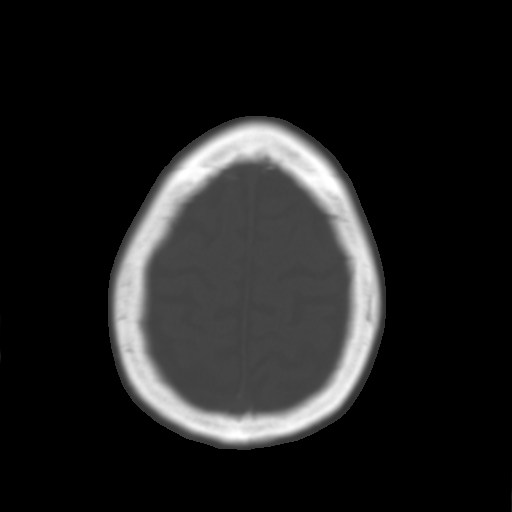
[im 25/30  brain]
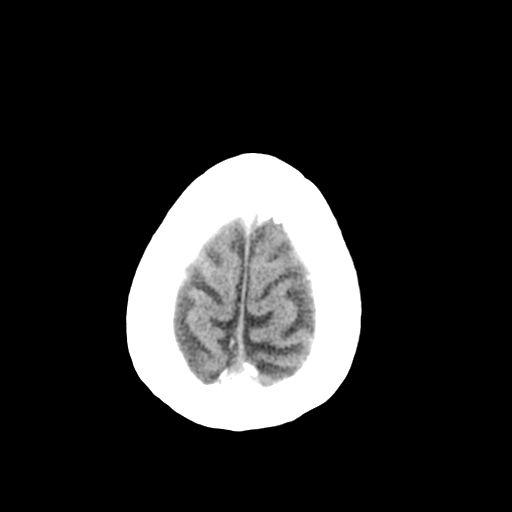
[im 27/30  brain]
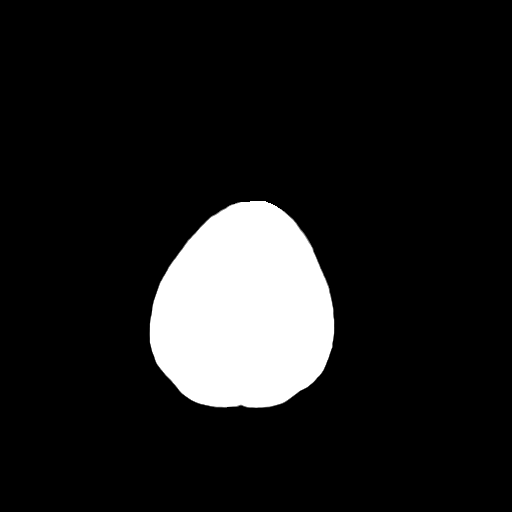
[im 29/30  brain]
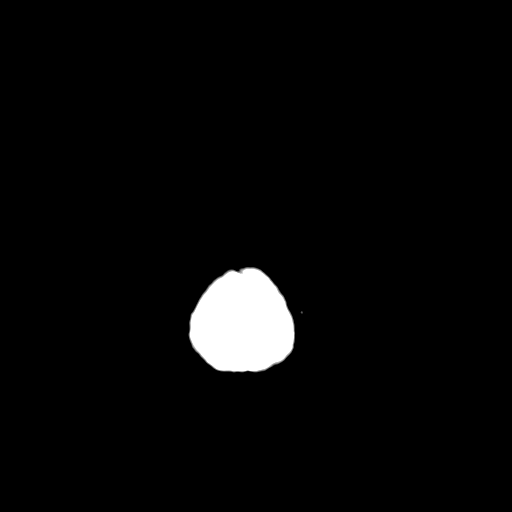

[16 of 30 positions shown; findings below may reference images not displayed]

FINDINGS: Moderate generalized atrophy is present.  Mild
periventricular white matter hypoattenuation is present.  No acute
cortical infarct, hemorrhage, or mass lesion is present.  A focal
calcification along the right transverse sinus likely represents an
arachnoid granulation.

The paranasal sinuses and mastoid air cells are clear.  The osseous
skull is intact.
IMPRESSION: 1.  Moderate generalized atrophy.
2.  No acute intracranial abnormality.

## 2013-06-24 ENCOUNTER — Other Ambulatory Visit: Payer: Self-pay | Admitting: Women's Health

## 2013-06-24 DIAGNOSIS — Z1231 Encounter for screening mammogram for malignant neoplasm of breast: Secondary | ICD-10-CM

## 2013-08-07 ENCOUNTER — Ambulatory Visit (HOSPITAL_COMMUNITY)
Admission: RE | Admit: 2013-08-07 | Discharge: 2013-08-07 | Disposition: A | Discharge status: Home / Self Care | Payer: Medicare Other | Source: Ambulatory Visit | Attending: Women's Health | Admitting: Women's Health

## 2013-08-07 DIAGNOSIS — Z1231 Encounter for screening mammogram for malignant neoplasm of breast: Secondary | ICD-10-CM | POA: Insufficient documentation

## 2013-08-14 ENCOUNTER — Encounter: Payer: Self-pay | Admitting: Women's Health

## 2013-08-14 ENCOUNTER — Ambulatory Visit (INDEPENDENT_AMBULATORY_CARE_PROVIDER_SITE_OTHER): Payer: Medicare Other | Admitting: Women's Health

## 2013-08-14 VITALS — BP 122/76 | Ht 60.0 in | Wt 116.0 lb

## 2013-08-14 DIAGNOSIS — N816 Rectocele: Secondary | ICD-10-CM | POA: Diagnosis not present

## 2013-08-14 NOTE — Progress Notes (Signed)
Jocelyn Wolfe [birth year] [id number]    History:    The patient presents for breast  and pelvic exam. TVH with anterior repair 1976. Normal Pap and mammogram history. Alzheimer's no meaningful communication. Husband performs all care of dressing, bathing, feeding, and changing depends. Unable to get out of bed, but once up and dressed walks until bedtime in the house. Normal mammogram history. Normal DEXA 2008.   Past medical history, past surgical history, family history and social history were all reviewed and documented in the EPIC chart. 2 grown sons one lives in [place], 1 lives in [place] wife has had a brain tumor in poor health. Numerous family members with hypertension, none with Alzheimer's.  Exam:  Filed Vitals:   08/14/13 1403  BP: 122/76    General appearance:  Normal Head/Neck:  Normal, without cervical or supraclavicular adenopathy. Thyroid:  Symmetrical, normal in size, without palpable masses or nodularity. Respiratory  Effort:  Normal  Auscultation:  Clear without wheezing or rhonchi Cardiovascular  Auscultation:  Regular rate, without rubs, murmurs or gallops  Edema/varicosities:  Not grossly evident Abdominal  Soft,nontender, without masses, guarding or rebound.  Liver/spleen:  No organomegaly noted  Hernia:  None appreciated  Skin  Inspection:  Grossly normal  Palpation:  Grossly normal Neurologic/psychiatric  Orientation:  Disoriented Mood/affect: No meaningful language Genitourinary    Breasts: Examined lying and sitting.     Right: Without masses, retractions, discharge or axillary adenopathy.     Left: Without masses, retractions, discharge or axillary adenopathy.   Inguinal/mons:  Normal without inguinal adenopathy  External genitalia:  Normal, skin in excellent condition.  BUS/Urethra/Skene's glands:  Normal  Bladder:  Normal  Vagina:  Normal exam limited no visible rectocele noted  Cervix: Absent Uterus:  Absent   bimanual no adnexal  fullness or tenderness, rectal exam confirmatory.  Adnexa/parametria:     Rt: Without masses or tenderness.   Lt: Without masses or tenderness.  Anus and perineum: Normal  Digital rectal exam: Normal sphincter tone without palpated masses or tenderness  Assessment/Plan:  72 y.o.MWF G2P2  for breast and pelvic exam.  Alzheimer's no meaningful communication /unable to perform any ADLs TVH with anterior repair 1976 Rectocele  improvement with weight loss Hypertension-primary care labs and meds  Plan:  annual mammogram which was normal 08/2013. Became upset with mammogram, husband believes it is still important to have done yearly. Husband denies need for home health, patient appears to be well cared for. Current on vaccinations.  Harrington Ch[facility]enger [facility], 4:36 PM 08/14/2013

## 2013-11-18 ENCOUNTER — Other Ambulatory Visit: Payer: Self-pay | Admitting: Neurology

## 2014-01-15 ENCOUNTER — Ambulatory Visit (INDEPENDENT_AMBULATORY_CARE_PROVIDER_SITE_OTHER): Payer: Medicare Other | Admitting: Neurology

## 2014-01-15 ENCOUNTER — Encounter: Payer: Self-pay | Admitting: Neurology

## 2014-01-15 VITALS — BP 128/60 | HR 70 | Wt 118.0 lb

## 2014-01-15 DIAGNOSIS — G309 Alzheimer's disease, unspecified: Principal | ICD-10-CM

## 2014-01-15 DIAGNOSIS — F0280 Dementia in other diseases classified elsewhere without behavioral disturbance: Secondary | ICD-10-CM

## 2014-01-15 NOTE — Patient Instructions (Signed)
Alzheimer Disease Alzheimer Disease (AD) is a mental disorder. It causes memory loss and loss of other mental functions, such as learning, thinking, solving problems, communicating, and completing tasks. The mental losses interfere with the ability to perform daily activities at work, at home, or in social situations. AD usually starts in the late 60s or early 70s but can start earlier in life (familial form). The mental changes caused by AD are permanent and worsen over time. As the illness progresses, the ability to do even the simplest things is lost. Survival with AD ranges from several years to as long as 20 years. CAUSES AD is caused by abnormally high levels of a protein (beta-amyloid) in the brain. This protein forms very small deposits within and around the brain's nerve cells. These deposits prevent the nerve cells from working properly. Experts are not certain what causes the beta-amyloid deposits in AD. RISK FACTORS The following major risk factors have been identified:  Increasing age.  Certain genetic variations, such as Down syndrome (trisomy 21). SYMPTOMS The earliest mental change in AD is mild memory loss of recent events, names, or phone numbers. Other symptoms at the beginning of AD include loss of objects, minor loss of vocabulary, and difficulty with complex tasks, such as paying bills or driving in unfamiliar locations. At this stage, you are still able to perform daily activities but need greater effort, more time, or memory aids. Other mental functions deteriorate as AD worsens. These changes slowly go from mild to severe. Symptoms at this stage include:  Difficulty remembering You may not be able to recall personal information such as your address and telephone number. You may become confused about the date, the season of the year, or your location.  Difficulty maintaining attention You may forget what you wanted to say during conversations and repeat what you have already  said.  Difficulty learning new information or tasks You may not remember what you read or the name of a new friend you met.  Difficulty counting or doing math You may have difficulty with complex math problems. You may make mistakes in paying bills or managing your checkbook.  Poor reasoning and judgment You may make poor decisions or not dress right for the weather.  Difficulty communicating You may have regular difficulty remembering words, naming objects, expressing yourself clearly, or writing sentences that make sense.  Difficulty performing familiar daily activities You may get lost driving in familiar locations or need help eating, bathing, dressing, grooming, or using the toilet. You may have difficulty maintaining bladder or bowel control.  Difficulty recognizing familiar faces You may confuse family members or close friends with one another. You may not recognize a close relative or may mistake strangers for family. AD also may cause changes in personality and behavior. These changes include loss of interest or motivation, social withdrawal, anxiety, difficulty sleeping, uncharacteristic anger or combativeness, a false belief that someone is trying to harm you (paranoia), seeing things that are not real (hallucinations), or agitation. Confusion and disruptive behavior are often worse at night and may be triggered by changes in the environment or acute medical issues. DIAGNOSIS  AD is diagnosed through an assessment by your health care provider. During this assessment, your health care provider will do the following:  Ask you and your family, friends, or caregiver questions about your symptoms, their frequency, their duration and progression, and the effect they are having on your life.  Ask questions about your personal and family medical history and use   of alcohol or drugs, including prescription medicine.  Perform a physical exam and order blood tests and brain imaging exams. Your  health care provider may refer you to a specialist for detailed evaluation of your mental functions (neuropsychological testing).  Many different brain disorders, medical conditions, and certain substances can cause symptoms that resemble AD symptoms. These must be ruled out before AD can be diagnosed. If AD is diagnosed, it will be considered either "possible" or "probable" AD. "Possible" AD means that your symptoms are typical of AD and no other disorder is causing them. "Probable" AD means that you also have a family history of AD or genetic test results that support the diagnosis. Certain tests, mostly used in research studies, are highly specific for AD.  TREATMENT  There is currently no cure for AD. The goals of treatment are to:  Slow down the progression of the disease.  Preserve mental function as long as possible.  Manage behavioral symptoms.  Make life easier for the person with AD and their caregivers. The following treatment options are available:  Medicine Certain medicines may help slow memory loss by changing the level of certain chemicals in the brain. Medicine may also help with behavioral symptoms.  Talk therapy Talk therapy provides education, support, and memory aids for people with AD. It is most effective in the early stages of the illness.  Caregiving Caregivers may be family members, friends, or trained medical professionals. They help the person with AD with daily life activities. Caregiving may take place at home or at a nursing facility.  Family support groups These provide education, emotional support, and information about community resources to family members who are taking care of the person with AD. Document Released: 05/03/2004 Document Revised: 04/24/2013 Document Reviewed: 12/28/2012 Exit[employer] Patient Information 2014 [employer].  

## 2014-01-15 NOTE — Progress Notes (Signed)
Reason for visit: Dementia  Dorena DewBarbara J Minehart is an 73 y.o. female  History of present illness:  Ms. Elson [place] is a 73 year old left-handed white female with a history of Alzheimer's disease. The patient is also having events of jerking and altered mental status felt secondary to seizures. The patient has had 2 recent events in this regard, but in general the events have not been extremely frequent. The patient is not on anticonvulsant medications at this time. She does remain on a low-dose Exelon patch. At this time, she is totally dependent for all activities of daily living. She requires assistance with feeding. The patient has been able to gain weight. She is on iron supplementation, and she has black stools with this. She returns for an evaluation. She is essentially nonverbal. Occasionally, she will fall, but the husband believes that she is trying to sit down thinking a chair is behind her.  Past Medical History  Diagnosis Date  . Alzheimer disease   . Uterine prolapse   . Hypertension     Past Surgical History  Procedure Laterality Date  . Bunionectomy    . Hemorroidectomy    . Diaphragm surgery      PARALYZED DIAPHRAGM  . Vaginal hysterectomy  1976    VAG HYST., ANTERIOR REPAIR    Family History  Problem Relation Age of Onset  . Cancer Mother     UTERINE  . Hypertension Father   . Hypertension Sister   . Heart disease Sister   . Diabetes Sister   . Hypertension Sister   . Heart disease Sister     Social history:  reports that she has never smoked. She does not have any smokeless tobacco history on file. She reports that she does not drink alcohol or use illicit drugs.    Allergies  Allergen Reactions  . Promethazine Hcl Other (See Comments)    Reaction unknown    Medications:  Current Outpatient Prescriptions on File Prior to Visit  Medication Sig Dispense Refill  . amLODipine (NORVASC) 10 MG tablet Take 10 mg by mouth daily.       Marland Kitchen. aspirin 81 MG tablet  Take 81 mg by mouth daily.        Marland Kitchen. atorvastatin (LIPITOR) 40 MG tablet Take 40 mg by mouth daily.        . benazepril-hydrochlorthiazide (LOTENSIN HCT) 20-25 MG per tablet Take 1 tablet by mouth daily.        . Calcium Carbonate-Vitamin D (CALCIUM + D PO) Take 1 tablet by mouth 2 (two) times daily. 600 mg of calcium.      . Cyanocobalamin (VITAMIN B12 PO) Take 1,000 mcg by mouth daily. Takes on tues and thurs only      . folic acid (FOLVITE) 1 MG tablet Take 1 mg by mouth daily.        . Glucosamine HCl 1000 MG TABS Take by mouth.        . sulfaSALAzine (AZULFIDINE) 500 MG EC tablet Take 500 mg by mouth 4 (four) times daily.         No current facility-administered medications on file prior to visit.    ROS:  Out of a complete 14 system review of symptoms, the patient complains only of the following symptoms, and all other reviewed systems are negative.  Excessive sweating Black stools Frequent waking Seizures  Blood pressure 128/60, pulse 70, weight 118 lb (53.524 kg).  Physical Exam  General: The patient is alert and cooperative at  the time of the examination.  Skin: No significant peripheral edema is noted.   Neurologic Exam  Mental status: The Mini-Mental status examination could not be performed, the patient is nonverbal.  Cranial nerves: Facial symmetry is present. The patient is nonverbal, she will blink to threat bilaterally.  Motor: The patient has good strength in all 4 extremities.  Sensory examination: Sensory testing is difficult to perform.  Coordination: The patient is unable to follow commands for finger-nose-finger and heel-to-shin testing. The patient has gegenhalten rigidity.  Gait and station: The patient is able to ambulate.  Reflexes: Deep tendon reflexes are symmetric.   Assessment/Plan:  1. Alzheimer's disease  2. Seizures  The patient will be taken off of the Exelon patch at this point. If the seizure-type events continue, the husband is  to contact our office, and we will add low-dose Depakene to her regimen. She will followup in 8 or 9 months.  Marlan Palau. Keith Briaunna Grindstaff MD 01/15/2014 7:52 PM  [facility] [address] [place], [place] [id number]  Phone [phone] Fax [phone]

## 2014-01-16 ENCOUNTER — Other Ambulatory Visit: Payer: Self-pay | Admitting: Neurology

## 2014-01-16 NOTE — Telephone Encounter (Signed)
Last OV says: The patient will be taken off of the Exelon patch at this point.

## 2014-06-30 ENCOUNTER — Other Ambulatory Visit: Payer: Self-pay | Admitting: Women's Health

## 2014-06-30 DIAGNOSIS — Z1231 Encounter for screening mammogram for malignant neoplasm of breast: Secondary | ICD-10-CM

## 2014-07-07 ENCOUNTER — Encounter: Payer: Self-pay | Admitting: Neurology

## 2014-07-23 ENCOUNTER — Encounter: Payer: Self-pay | Admitting: Neurology

## 2014-07-29 ENCOUNTER — Encounter: Payer: Self-pay | Admitting: Neurology

## 2014-08-08 ENCOUNTER — Ambulatory Visit (HOSPITAL_COMMUNITY)
Admission: RE | Admit: 2014-08-08 | Discharge: 2014-08-08 | Disposition: A | Discharge status: Home / Self Care | Payer: Medicare Other | Source: Ambulatory Visit | Attending: Women's Health | Admitting: Women's Health

## 2014-08-08 DIAGNOSIS — Z1231 Encounter for screening mammogram for malignant neoplasm of breast: Secondary | ICD-10-CM | POA: Insufficient documentation

## 2014-08-15 ENCOUNTER — Ambulatory Visit (INDEPENDENT_AMBULATORY_CARE_PROVIDER_SITE_OTHER): Payer: Medicare Other | Admitting: Women's Health

## 2014-08-15 ENCOUNTER — Encounter: Payer: Self-pay | Admitting: Women's Health

## 2014-08-15 VITALS — BP 132/68

## 2014-08-15 DIAGNOSIS — N816 Rectocele: Secondary | ICD-10-CM

## 2014-08-15 NOTE — Progress Notes (Signed)
Patient ID: Jocelyn DewBarbara J Shibuya, female   DOB: 12/17/1940, 73 y.o.   MRN: [id number] Presented for annual exam. Advanced Alzheimer, unable to do any ADLs. Wears depends 24 hours daily, occasional toileting when taken. Able to stand but not walk. No verbal communication, occasional sounds. Husband does total care for patient. Normal Pap and mammogram history. 31976 TVH for uterine prolapse. Husband reports able to care for wife without outside help at this time.  Exam: Appears clean, well cared for. No visible bruising. Heart regular rate and rhythm. Skin without erythema, irritation or skin breakdown in perineal area.  Advanced Alzheimer's Excellent homecare per husband  Plan: Reviewed does not need to do annual mammogram screen or annual GYN exam. Continue care with primary care.

## 2014-09-17 ENCOUNTER — Ambulatory Visit: Payer: Medicare Other | Admitting: Adult Health

## 2015-07-06 ENCOUNTER — Other Ambulatory Visit: Payer: Self-pay

## 2016-08-27 ENCOUNTER — Emergency Department (HOSPITAL_COMMUNITY)
Admission: EM | Admit: 2016-08-27 | Discharge: 2016-09-05 | Disposition: E | Payer: Medicare Other | Attending: Emergency Medicine | Admitting: Emergency Medicine

## 2016-08-27 ENCOUNTER — Emergency Department (HOSPITAL_COMMUNITY): Payer: Medicare Other

## 2016-08-27 ENCOUNTER — Encounter (HOSPITAL_COMMUNITY): Payer: Self-pay | Admitting: *Deleted

## 2016-08-27 DIAGNOSIS — Z7982 Long term (current) use of aspirin: Secondary | ICD-10-CM | POA: Diagnosis not present

## 2016-08-27 DIAGNOSIS — I469 Cardiac arrest, cause unspecified: Secondary | ICD-10-CM | POA: Insufficient documentation

## 2016-08-27 DIAGNOSIS — I10 Essential (primary) hypertension: Secondary | ICD-10-CM | POA: Insufficient documentation

## 2016-08-27 MED ORDER — LORAZEPAM 2 MG/ML IJ SOLN
INTRAMUSCULAR | Status: AC
  Filled 2016-08-27: qty 1

## 2016-08-27 MED ORDER — MORPHINE SULFATE (PF) 4 MG/ML IV SOLN: 4.0000 mg | Freq: Once | INTRAVENOUS | Status: DC

## 2016-08-27 MED ORDER — EPINEPHRINE PF 1 MG/ML IJ SOLN
0.5000 ug/min | INTRAVENOUS | Status: DC
  Filled 2016-08-27: qty 4

## 2016-08-27 MED ORDER — LORAZEPAM 2 MG/ML IJ SOLN
1.0000 mg | Freq: Once | INTRAMUSCULAR | Status: AC
  Administered 2016-08-27: 1 mg via INTRAVENOUS

## 2016-08-27 MED ORDER — MORPHINE SULFATE (PF) 4 MG/ML IV SOLN
4.0000 mg | INTRAVENOUS | Status: DC | PRN
  Administered 2016-08-27: 4 mg via INTRAVENOUS
  Filled 2016-08-27: qty 1

## 2016-08-27 MED ORDER — SODIUM CHLORIDE 0.9 % IV SOLN
5.0000 mg/h | INTRAVENOUS | Status: DC
  Filled 2016-08-27: qty 10

## 2016-08-31 MED FILL — Medication: Qty: 1 | Status: AC

## 2016-09-05 NOTE — Code Documentation (Signed)
All thumper equipment removed

## 2016-09-05 NOTE — Code Documentation (Signed)
Pulse check.   

## 2016-09-05 NOTE — Code Documentation (Signed)
No labs  Family is saying that the pt would not want her life prolonged on the vent  resp therapy still bagging

## 2016-09-05 NOTE — Code Documentation (Signed)
thyumper has been stopped  resp therapy still bagging

## 2016-09-05 NOTE — ED Notes (Signed)
Family crying now as her heart rate slows gradually

## 2016-09-05 NOTE — Progress Notes (Signed)
RT note: RT received patient from EMS with CPR in progress and a King airway tube in place. MD listened for BLBs and patient  had a positive ETCO2 during manual ventilation with AMBU bag. RT continued to manually ventilate from 15:15 until 16:15 until patient was terminally extubated per MD order.

## 2016-09-05 NOTE — ED Notes (Signed)
[place] donor notified not a [employer] [facility] morgue list filled out  Pt to morgue

## 2016-09-05 NOTE — ED Notes (Signed)
Pt pronounced by the ed res

## 2016-09-05 NOTE — ED Notes (Signed)
Res back in to talk with the family.

## 2016-09-05 NOTE — Code Documentation (Signed)
All labs and xrays cancelled

## 2016-09-05 NOTE — ED Notes (Signed)
Offered pt's family refreshments. Family politely refused and were advised to find me if they needed anything.

## 2016-09-05 NOTE — Code Documentation (Signed)
Dr Jodi Mourningzavitz has ecided to not do anythiung else but just watche her if her heart does not pick up  It will be discontinued

## 2016-09-05 NOTE — Code Documentation (Signed)
Pulse present   cpr has been stopped  At present

## 2016-09-05 NOTE — Code Documentation (Signed)
Comfort care only °

## 2016-09-05 NOTE — Code Documentation (Signed)
Hr is 123

## 2016-09-05 NOTE — Code Documentation (Signed)
Family remains at the bedisde

## 2016-09-05 NOTE — Code Documentation (Signed)
Ed res at bedside for king airway removal

## 2016-09-05 NOTE — Code Documentation (Signed)
Family at bedside discussing her outcome per dr Jodi Mourningzavitz again  Hr 123

## 2016-09-05 NOTE — ED Triage Notes (Signed)
Cardiac arrest working for 1 hr

## 2016-09-05 NOTE — Code Documentation (Signed)
The king airway will be removed and the pt will get comfort care only  Family is agreeing

## 2016-09-05 NOTE — Code Documentation (Signed)
Family remains at the bedside chaplain in with family  Briefly  Ms 4 mg given

## 2016-09-05 NOTE — ED Provider Notes (Signed)
[facility]-EMERGENCY DEPT Provider Note   CSN: [id number] Arrival date & time: 11-Sep-2016  1515     History   Chief Complaint Chief Complaint  Patient presents with  . Cardiac Arrest    HPI Jocelyn Wolfe is a 76 y.o. female.  The history is provided by the EMS personnel and the spouse.  Cardiac Arrest  Witnessed by:  Family member Incident location:  Home Time since incident:  1 hour Time before BLS initiated:  Immediate (bystander CPR, likely poor quality per fire dept) Time before ALS initiated:  5-8 minutes Condition upon EMS arrival:  Apneic and unresponsive Pulse:  Absent Initial cardiac rhythm per EMS:  PEA Treatments prior to arrival:  ACLS protocol and vascular access [facility] airway) Medications given prior to ED:  Epinephrine (epi x2) IV access type:  Peripheral and intraosseous Airway: King airway. Rhythm on admission to ED:  Atrial fibrillation -Patient was being fed by her husband when she seemed to have aspirated and then became unresponsive and had seizure-like activity. Husband started to perform CPR for 5-10 minutes prior to fire department arrival. Patient was in PEA per EMS. Total of around 10 rounds of CPR performed in the field with 2 rounds of epinephrine given. Patient returned [place] wanting a circulation but had worsening bradycardia and lost pulses again shortly before arriving to the hospital. CPR was resumed.   Past Medical History:  Diagnosis Date  . Alzheimer disease   . Hypertension   . Uterine prolapse     Patient Active Problem List   Diagnosis Date Noted  . Hypertension   . Alzheimer disease   . Uterine prolapse     Past Surgical History:  Procedure Laterality Date  . BUNIONECTOMY    . DIAPHRAGM SURGERY     PARALYZED DIAPHRAGM  . HEMORROIDECTOMY    . VAGINAL HYSTERECTOMY  1976   VAG HYST., ANTERIOR REPAIR    OB History    Gravida Para Term Preterm AB Living   2 2 2     2    SAB TAB Ectopic Multiple Live Births                    Home Medications    Prior to Admission medications   Medication Sig Start Date End Date Taking? Authorizing Provider  amLODipine (NORVASC) 10 MG tablet Take 10 mg by mouth daily.     Historical Provider, MD  aspirin 81 MG tablet Take 81 mg by mouth daily.      Historical Provider, MD  atorvastatin (LIPITOR) 40 MG tablet Take 40 mg by mouth daily.      Historical Provider, MD  benazepril-hydrochlorthiazide (LOTENSIN HCT) 20-25 MG per tablet Take 1 tablet by mouth daily.      Historical Provider, MD  Calcium Carbonate-Vitamin D (CALCIUM + D PO) Take 1 tablet by mouth 2 (two) times daily. 600 mg of calcium.    Historical Provider, MD  Cyanocobalamin (VITAMIN B12 PO) Take 1,000 mcg by mouth daily. Takes on tues and thurs only    Historical Provider, MD  folic acid (FOLVITE) 1 MG tablet Take 1 mg by mouth daily.      Historical Provider, MD  Glucosamine HCl 1000 MG TABS Take 1 tablet by mouth daily.     Historical Provider, MD  Iron-FA-B Cmp-C-Biot-Probiotic (FUSION PLUS) CAPS Take 1 capsule by mouth daily. 07/19/14   Historical Provider, MD  sulfaSALAzine (AZULFIDINE) 500 MG EC tablet Take 500 mg by mouth 4 (four) times daily.  Historical Provider, MD    Family History Family History  Problem Relation Age of Onset  . Cancer Mother     UTERINE  . Hypertension Father   . Hypertension Sister   . Heart disease Sister   . Diabetes Sister   . Hypertension Sister   . Heart disease Sister     Social History Social History  Substance Use Topics  . Smoking status: Never Smoker  . Smokeless tobacco: Never Used  . Alcohol use No     Allergies   Promethazine hcl   Review of Systems Review of Systems  Unable to perform ROS: Patient unresponsive     Physical Exam Updated Vital Signs BP (!) 0/0   Pulse 100   SpO2 (!) 82%   Physical Exam  Constitutional: She appears well-developed. She appears distressed.  HENT:  Head: Normocephalic and atraumatic.  Vomitus in  oropharynx  Eyes:  Pupils unreactive  Cardiovascular: An irregularly irregular rhythm present. Tachycardia present.  Exam reveals decreased pulses.   Pulmonary/Chest:  Equal b/l breath sounds. No respiratory effort  Abdominal: She exhibits no distension. There is no tenderness.  Musculoskeletal: She exhibits no deformity.  Neurological: She is unresponsive. GCS eye subscore is 1. GCS verbal subscore is 1. GCS motor subscore is 1.  Nursing note and vitals reviewed.    ED Treatments / Results  Labs (all labs ordered are listed, but only abnormal results are displayed) Labs Reviewed - No data to display  EKG  EKG Interpretation  Date/Time:  Saturday August 27 2016 15:31:08 EST Ventricular Rate:  117 PR Interval:    QRS Duration: 96 QT Interval:  346 QTC Calculation: 483 R Axis:   85 Text Interpretation:  Atrial fibrillation Borderline right axis deviation Probable LVH with secondary repol abnrm Baseline wander in lead(s) V2 Confirmed by ZAVITZ MD, Ivin BootyJOSHUA ([id number]) on 11-Apr-2016 3:40:05 PM       Radiology No results found.  Procedures Procedures (including critical care time)  Medications Ordered in ED Medications  morphine 4 MG/ML injection 4 mg (not administered)  LORazepam (ATIVAN) 2 MG/ML injection (not administered)  LORazepam (ATIVAN) injection 1 mg (1 mg Intravenous Given 2016-07-26 1553)     Initial Impression / Assessment and Plan / ED Course  I have reviewed the triage vital signs and the nursing notes.  Pertinent labs & imaging results that were available during my care of the patient were reviewed by me and considered in my medical decision making (see chart for details).  Clinical Course    Patient is a [age] old female with history of hypertension, Alzheimer's who presents to the emergency department after cardiac arrest at home. Patient bystander CPR for 5-10 minutes. Patient received 2 rounds of CPR and 2 rounds of epinephrine prior to arrival. Patient  return of spontaneous circulation have lost pulses shortly prior to arrival.  On arrival patient receiving active CPR. Pulse check revealed absent pulses so CPR was resumed and patient given 1 mg epinephrine. 2 rounds of CPR performed in the emergency department. On repeat pulse check bedside critical Showed Active Cardiac Activity with Intact Pulses. Patient in [place] Fibrillation with RVR on the Monitor. Patient with No Respiratory Effort.   After Speaking with the Family [facility]. Likely prognosis is very poor and this is explained to the family. Family would not want her to be on life support. Patient is made DO NOT RESUSCITATE/comfort care. Morphine and Ativan ordered. Once family is ready we will  withdraw care and [facility]  King airway was removed after morphine and Ativan given. Patient with very shallow agonal breathing. I was called to bedside to pronounce the time of death. Patient had no heart sounds, no breath sounds, absent pulses and unreactive pupils. Time of death called at 1631.  Pt seen with Dr. Jodi MourningZavitz.      Final Clinical Impressions(s) / ED Diagnoses   Final diagnoses:  Cardiac arrest [facility](HCC)  PEA (Pulseless electrical activity) [facility](HCC)    New Prescriptions New Prescriptions   No medications on file     Dwana MelenaRobin Tristyn Demarest, DO 21-Jul-2016 1633    Blane OharaJoshua Zavitz, MD 08/28/16 0020    Blane OharaJoshua Zavitz, MD 08/28/16 0020

## 2016-09-05 NOTE — Code Documentation (Signed)
Family remains at the bedside.

## 2016-09-05 NOTE — ED Triage Notes (Signed)
The pt arrived by gems at 1514  The pts husband was feeing the pt when she appeared  To choke and stopped breathing at approx 1413  The husband started cpr but ems did not think the cpr was effective.  Ems worked on this pt for 25 minutes and was going to stop with no  Rhy.  They were transporting the pt 47 a  When the pts pulses  Stated again  She hasd received 2 epine  On arrival  cpr in pregress

## 2016-09-05 NOTE — Code Documentation (Signed)
rhy pea

## 2016-09-05 NOTE — Code Documentation (Signed)
Pt still being bagged by resp therapy

## 2016-09-05 NOTE — Code Documentation (Signed)
kikng airway removed by resp therapy with ed res a and family at the bedside

## 2016-09-05 NOTE — Progress Notes (Signed)
   03/03/16 1515  Clinical Encounter Type  Visited With Patient and family together  Visit Type ED  Referral From Other (Comment) Valinda Party(Stemi)  Spiritual Encounters  Spiritual Needs Emotional  Stress Factors  Patient Stress Factors Not reviewed  Family Stress Factors Family relationships  Family in room when arrived. Gave introduction. Offered emotional support. Family coping.

## 2016-09-05 NOTE — Code Documentation (Signed)
Hr 93

## 2016-09-05 NOTE — Code Documentation (Signed)
Hr 101 resp therapy still bagging through the king airway that was not rep[laced on arrival.  Code has been going on now over one hour  Family  At the bedside

## 2016-09-05 NOTE — Code Documentation (Signed)
Family still being  Attempting to decide the outcome

## 2016-09-05 NOTE — Code Documentation (Signed)
Temp below 88

## 2016-09-05 NOTE — Code Documentation (Signed)
epine 1 briastjet iv elizabeth 1517

## 2016-09-05 NOTE — Code Documentation (Signed)
Ativan given morphine order not in pyxis yet

## 2016-09-05 NOTE — Code Documentation (Signed)
Pulse check  cpr continues  Dr Ricki Rodriguezjavitz at bedside with ultra soound.  Pt blue  On arrival   On thumper  Husband at the bedside

## 2016-09-05 NOTE — Code Documentation (Signed)
Husband at the bedside  rn placed another iv per resident  ????

## 2016-09-05 NOTE — Code Documentation (Signed)
asystole 

## 2016-09-05 DEATH — deceased
